# Patient Record
Sex: Female | Born: 2000 | Race: Black or African American | Hispanic: No | Marital: Single | Smoking: Never smoker
Health system: Southern US, Community
[De-identification: ages and names within clinical notes are randomized; demographics above are authoritative.]

---

## 2019-08-13 ENCOUNTER — Other Ambulatory Visit: Payer: Self-pay

## 2019-08-13 DIAGNOSIS — Z20822 Contact with and (suspected) exposure to covid-19: Secondary | ICD-10-CM

## 2019-08-14 LAB — NOVEL CORONAVIRUS, NAA: SARS-CoV-2, NAA: NOT DETECTED

## 2020-05-03 ENCOUNTER — Other Ambulatory Visit: Payer: Self-pay

## 2020-05-03 ENCOUNTER — Emergency Department (HOSPITAL_COMMUNITY)
Admission: EM | Admit: 2020-05-03 | Discharge: 2020-05-03 | Disposition: A | Discharge status: Home / Self Care | Payer: BC Managed Care – PPO | Attending: Emergency Medicine | Admitting: Emergency Medicine

## 2020-05-03 ENCOUNTER — Encounter (HOSPITAL_COMMUNITY): Payer: Self-pay | Admitting: Emergency Medicine

## 2020-05-03 ENCOUNTER — Emergency Department (HOSPITAL_COMMUNITY): Payer: BC Managed Care – PPO

## 2020-05-03 DIAGNOSIS — Y92410 Unspecified street and highway as the place of occurrence of the external cause: Secondary | ICD-10-CM | POA: Diagnosis not present

## 2020-05-03 DIAGNOSIS — F1210 Cannabis abuse, uncomplicated: Secondary | ICD-10-CM | POA: Diagnosis not present

## 2020-05-03 DIAGNOSIS — Y999 Unspecified external cause status: Secondary | ICD-10-CM | POA: Diagnosis not present

## 2020-05-03 DIAGNOSIS — Y9389 Activity, other specified: Secondary | ICD-10-CM | POA: Diagnosis not present

## 2020-05-03 DIAGNOSIS — M25561 Pain in right knee: Secondary | ICD-10-CM | POA: Diagnosis present

## 2020-05-03 DIAGNOSIS — M25511 Pain in right shoulder: Secondary | ICD-10-CM | POA: Insufficient documentation

## 2020-05-03 MED ORDER — METHOCARBAMOL 500 MG PO TABS
500.0000 mg | ORAL_TABLET | Freq: Two times a day (BID) | ORAL | 0 refills | Status: DC
Start: 2020-05-03 — End: 2020-05-20

## 2020-05-03 MED ORDER — ACETAMINOPHEN 500 MG PO TABS
1000.0000 mg | ORAL_TABLET | Freq: Once | ORAL | Status: AC
  Administered 2020-05-03: 1000 mg via ORAL
  Filled 2020-05-03: qty 2
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
## 2020-05-03 NOTE — ED Notes (Signed)
Pt to Xray.

## 2020-05-03 NOTE — Discharge Instructions (Signed)
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
## 2020-05-20 ENCOUNTER — Ambulatory Visit: Payer: BC Managed Care – PPO | Attending: Family Medicine | Admitting: Physician Assistant

## 2020-05-20 ENCOUNTER — Other Ambulatory Visit: Payer: Self-pay

## 2020-05-20 DIAGNOSIS — M25511 Pain in right shoulder: Secondary | ICD-10-CM | POA: Diagnosis not present

## 2020-05-20 DIAGNOSIS — Z09 Encounter for follow-up examination after completed treatment for conditions other than malignant neoplasm: Secondary | ICD-10-CM | POA: Diagnosis not present

## 2020-05-20 DIAGNOSIS — M25512 Pain in left shoulder: Secondary | ICD-10-CM | POA: Diagnosis not present

## 2020-05-20 MED ORDER — CYCLOBENZAPRINE HCL 5 MG PO TABS
5.0000 mg | ORAL_TABLET | Freq: Three times a day (TID) | ORAL | 0 refills | Status: AC | PRN
Start: 2020-05-20 — End: ?

## 2020-06-04 ENCOUNTER — Ambulatory Visit: Payer: BC Managed Care – PPO | Admitting: Internal Medicine

## 2021-03-12 IMAGING — CR DG SHOULDER 2+V*R*
3 series · 3 of 3 positions shown · non-contrast
Comparison: None.

CLINICAL DATA: Shoulder pain.

EXAM:
RIGHT SHOULDER - 2+ VIEW

[t shoulder external right]
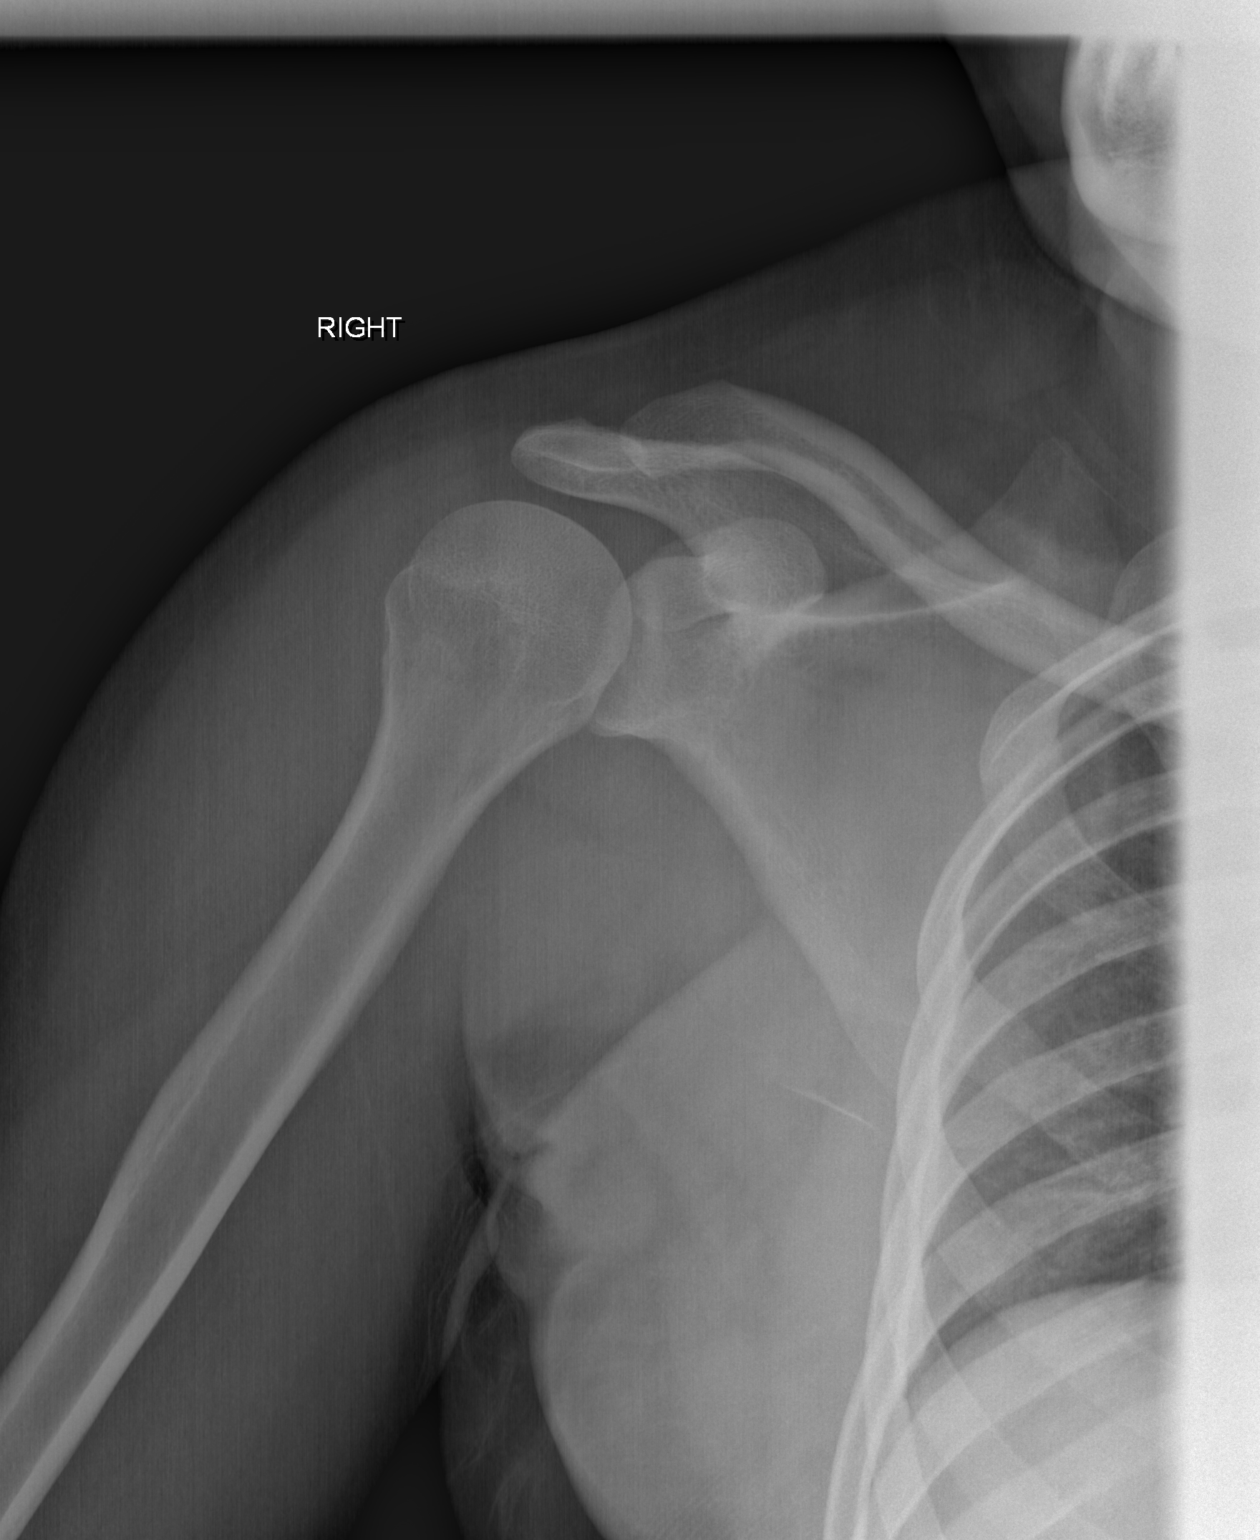

[t scapula y-view right]
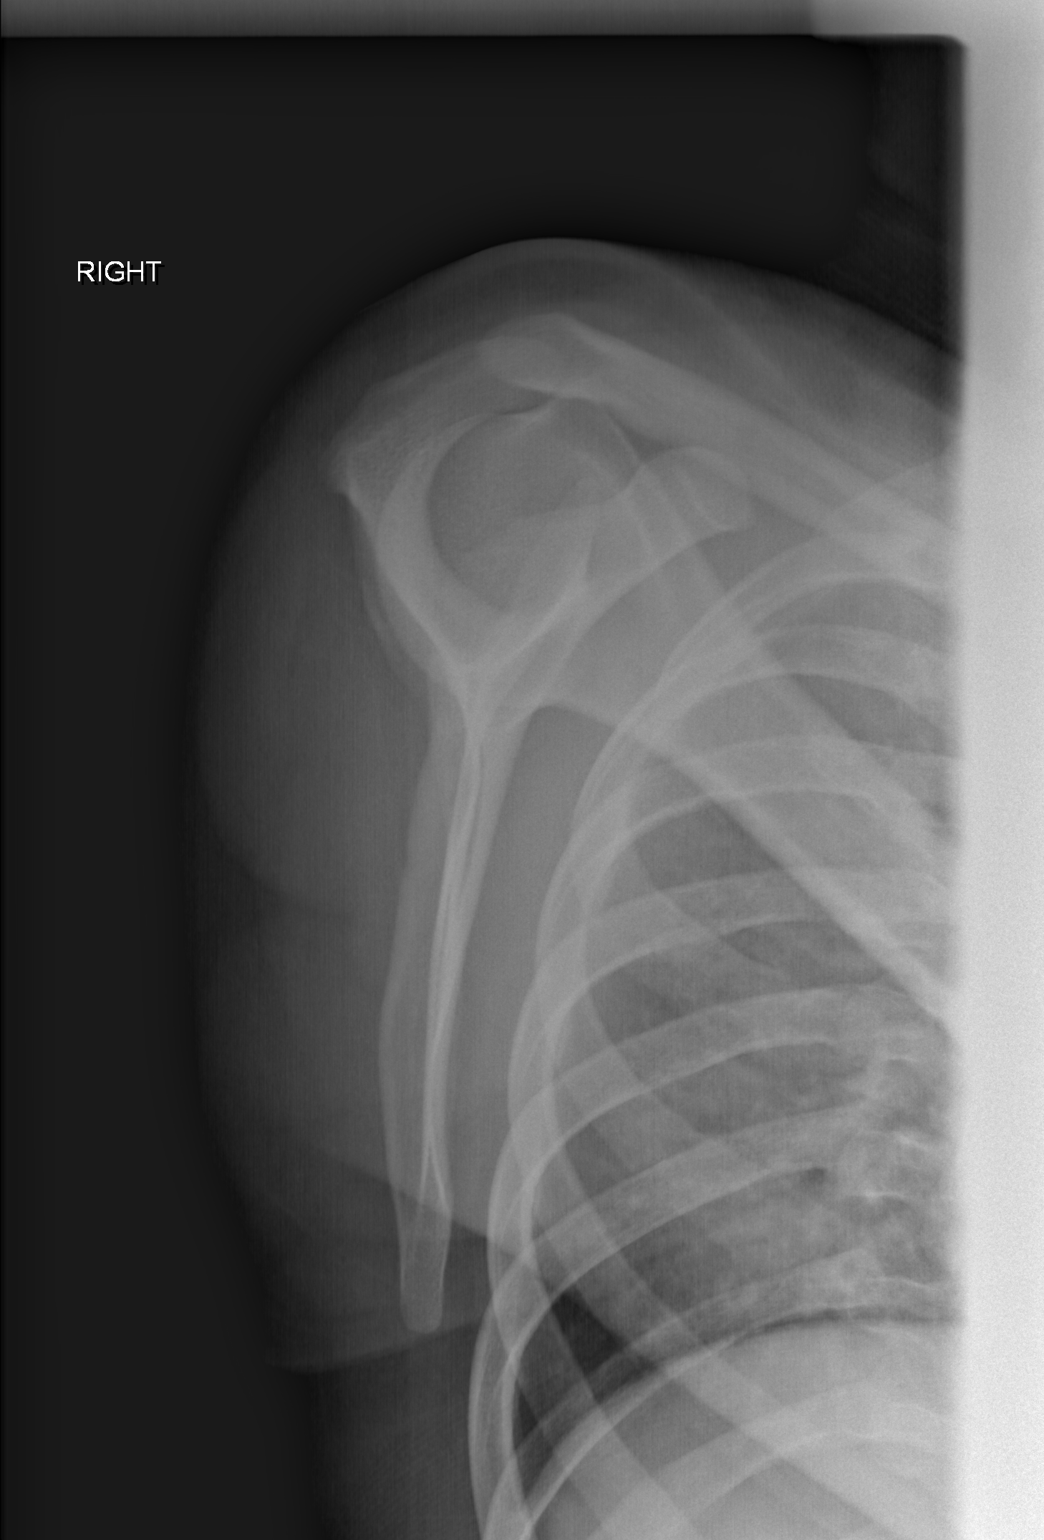

[t shoulder internal right]
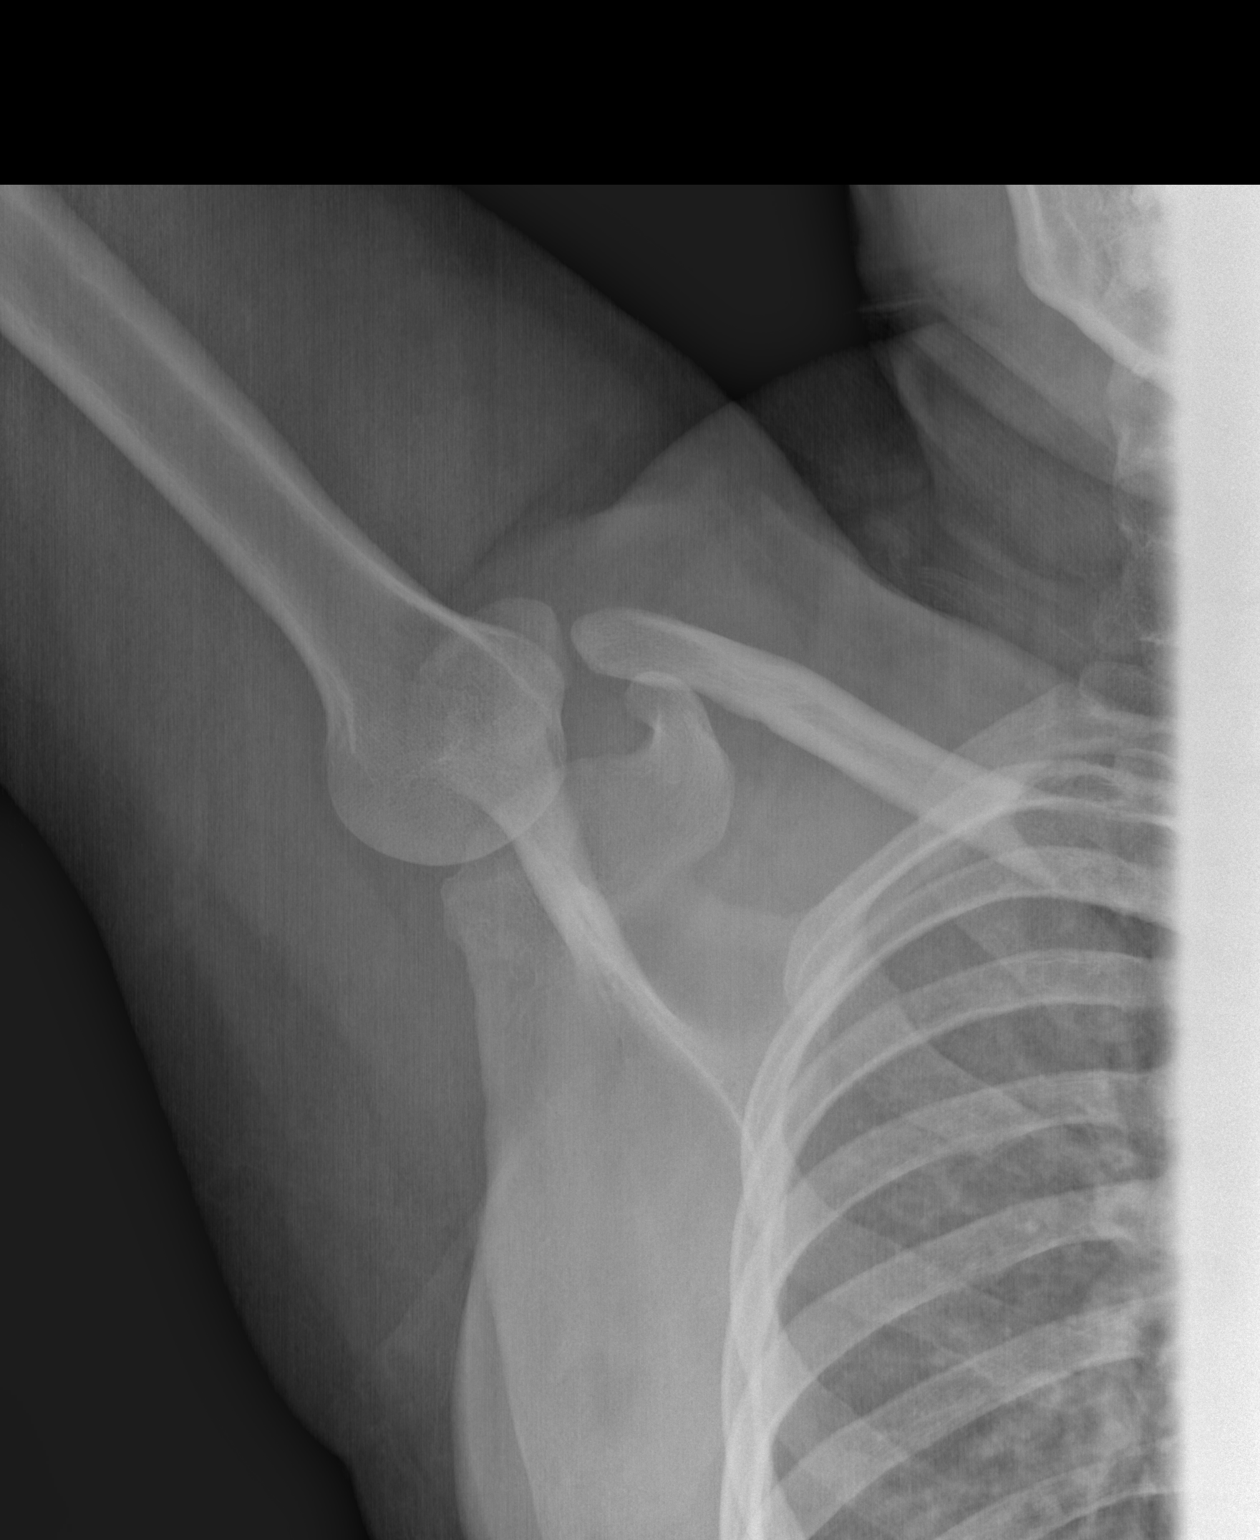

[3 of 3 positions shown; findings below may reference images not displayed]

FINDINGS: There is no evidence of fracture or dislocation. There is no
evidence of arthropathy or other focal bone abnormality. Soft
tissues are unremarkable.
IMPRESSION: Negative.

## 2021-03-12 IMAGING — CR DG TIBIA/FIBULA 2V*R*
2 series · 2 of 2 positions shown · non-contrast
Comparison: None.

CLINICAL DATA: Pain.

EXAM:
RIGHT TIBIA AND FIBULA - 2 VIEW

[x tib-fib ap right]
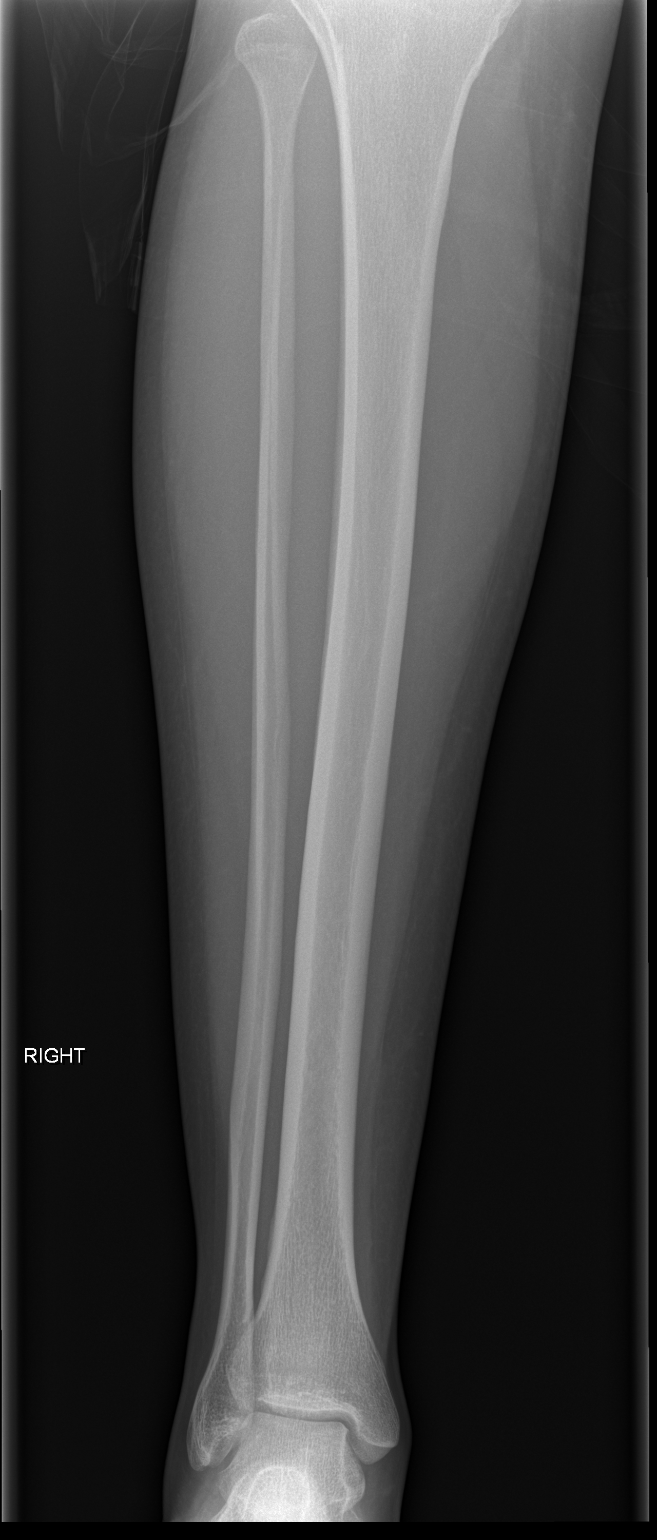

[x tib-fib lat right]
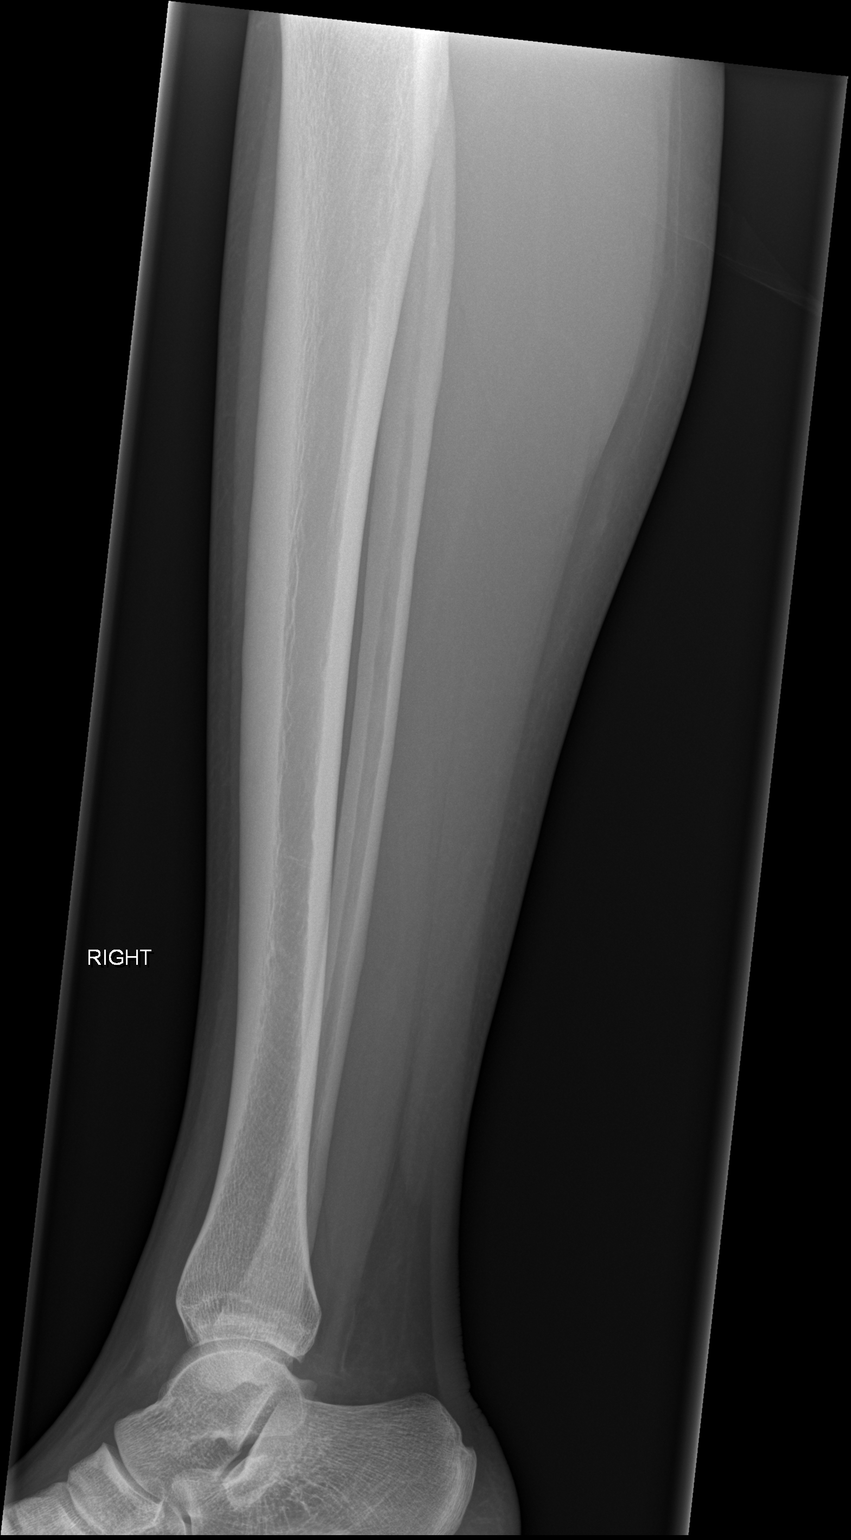

[2 of 2 positions shown; findings below may reference images not displayed]

FINDINGS: There is no evidence of fracture or other focal bone lesions. Soft
tissues are unremarkable.
IMPRESSION: Negative.
